# Patient Record
Sex: Male | Born: 1965 | Race: White | Hispanic: No | Smoking: Former smoker
Health system: Southern US, Community
[De-identification: ages and names within clinical notes are randomized; demographics above are authoritative.]

## PROBLEM LIST (undated history)

## (undated) DIAGNOSIS — L4050 Arthropathic psoriasis, unspecified: Secondary | ICD-10-CM

## (undated) DIAGNOSIS — N400 Enlarged prostate without lower urinary tract symptoms: Secondary | ICD-10-CM

## (undated) DIAGNOSIS — T7840XA Allergy, unspecified, initial encounter: Secondary | ICD-10-CM

## (undated) DIAGNOSIS — Z86010 Personal history of colonic polyps: Secondary | ICD-10-CM

## (undated) HISTORY — DX: Arthropathic psoriasis, unspecified: L40.50

## (undated) HISTORY — DX: Personal history of colonic polyps: Z86.010

## (undated) HISTORY — DX: Benign prostatic hyperplasia without lower urinary tract symptoms: N40.0

## (undated) HISTORY — DX: Allergy, unspecified, initial encounter: T78.40XA

## (undated) HISTORY — PX: WISDOM TOOTH EXTRACTION: SHX21

---

## 2013-09-28 ENCOUNTER — Encounter: Payer: Self-pay | Admitting: Sports Medicine

## 2013-09-28 ENCOUNTER — Ambulatory Visit (INDEPENDENT_AMBULATORY_CARE_PROVIDER_SITE_OTHER): Payer: Managed Care, Other (non HMO) | Admitting: Sports Medicine

## 2013-09-28 VITALS — BP 140/83 | HR 89 | Ht 72.0 in | Wt 193.0 lb

## 2013-09-28 DIAGNOSIS — N139 Obstructive and reflux uropathy, unspecified: Secondary | ICD-10-CM

## 2013-09-28 DIAGNOSIS — Z0000 Encounter for general adult medical examination without abnormal findings: Secondary | ICD-10-CM | POA: Insufficient documentation

## 2013-09-28 DIAGNOSIS — L4050 Arthropathic psoriasis, unspecified: Secondary | ICD-10-CM

## 2013-09-28 DIAGNOSIS — R030 Elevated blood-pressure reading, without diagnosis of hypertension: Secondary | ICD-10-CM

## 2013-09-28 DIAGNOSIS — Z299 Encounter for prophylactic measures, unspecified: Secondary | ICD-10-CM

## 2013-09-28 LAB — URINALYSIS
Bilirubin Urine: NEGATIVE
Glucose, UA: NEGATIVE mg/dL
Hgb urine dipstick: NEGATIVE
Ketones, ur: NEGATIVE mg/dL
Leukocytes, UA: NEGATIVE
Nitrite: NEGATIVE
Protein, ur: NEGATIVE mg/dL
Specific Gravity, Urine: >1.03 — ABNORMAL HIGH (ref 1.005–1.030)
Urobilinogen, UA: 0.2 mg/dL (ref 0.0–1.0)
pH: 5 (ref 5.0–8.0)

## 2013-09-28 MED ORDER — CIPROFLOXACIN HCL 750 MG PO TABS: 750.0000 mg | ORAL_TABLET | Freq: Two times a day (BID) | ORAL | Status: DC

## 2013-09-28 NOTE — Assessment & Plan Note (Signed)
Blood work has been done in the past, he is going to forward it to me.

## 2013-09-28 NOTE — Assessment & Plan Note (Signed)
Currently sees a rheumatologist in [facility]. He continues to have an irritating rash on the glans. He is using desonide, I would like him to provide a list of topical steroids that he has used in the past, desonide is getting a bit expensive.

## 2013-09-28 NOTE — Assessment & Plan Note (Signed)
He does get a small amount of burning with urination, has been checked for gonorrhea and chlamydia in the past and was negative. Considering current treatment with methotrexate, burning with urination and new onset obstructive uropathy symptoms we do need to consider prostatitis. He declines prostate exam today, I am going to treat him in therapy with ciprofloxacin for one month. He will come back to see [place] in one month for further evaluation, at that point we will perform a prostate exam, and I will likely start an alpha-blocker.

## 2013-09-28 NOTE — Assessment & Plan Note (Signed)
We will keep an eye on this.

## 2013-09-28 NOTE — Progress Notes (Signed)
  Subjective:    CC: Establish care.   HPI:  Psoriatic arthritis: Has tried multiple medications with his rheumatologist, early methotrexate has kept flares a minimum. He continues to have psoriatic lesions on his glands, which has been improved significantly with the desonide cream. Unfortunately the cream has thinned out the skin, and he does get some irritation now. This was also too expensive for him. When he does get flares they are moderate, persistent, and typically localized in the knee but also his ankles and hips. He did have an effusion with an aspiration and injection by his rheumatologist in the past.  Urinary symptoms: For the past several months has been getting a slight hernia with urination as well as urgency, frequency, and nocturia. No fevers, chills. This seemed to have started when he started methotrexate.  Elevated blood pressure: No history of hypertension, no headaches or visual changes, chest pain, or the swelling.  Preventive medical recently had blood work done late last year, he will have this forwarded to me.  Past medical history, Surgical history, Family history not pertinant except as noted below, Social history, Allergies, and medications have been entered into the medical record, reviewed, and no changes needed.   Review of Systems: No headache, visual changes, nausea, vomiting, diarrhea, constipation, dizziness, abdominal pain, skin rash, fevers, chills, night sweats, swollen lymph nodes, weight loss, chest pain, body aches, joint swelling, muscle aches, shortness of breath, mood changes, visual or auditory hallucinations.  Objective:    General: Well Developed, well nourished, and in no acute distress.  Neuro: Alert and oriented x3, extra-ocular muscles intact, sensation grossly intact.  HEENT: Normocephalic, atraumatic, pupils equal round reactive to light, neck supple, no masses, no lymphadenopathy, thyroid nonpalpable.  Skin: Warm and dry, no rashes noted.   Cardiac: Regular rate and rhythm, no murmurs rubs or gallops.  Respiratory: Clear to auscultation bilaterally. Not using accessory muscles, speaking in full sentences.  Abdominal: Soft, nontender, nondistended, positive bowel sounds, no masses, no organomegaly.  Musculoskeletal: Shoulder, elbow, wrist, hip, knee, ankle stable, and with full range of motion.  Impression and Recommendations:    The patient was counselled, risk factors were discussed, anticipatory guidance given.

## 2013-09-28 NOTE — Patient Instructions (Signed)
Look to see whether you have tried vitamin D. cream, Kenalog (triamcinolone) cream, or clobetasol cream.

## 2013-09-29 LAB — URINE CULTURE
Colony Count: NO GROWTH
Organism ID, Bacteria: NO GROWTH

## 2014-01-20 ENCOUNTER — Encounter: Payer: Self-pay | Admitting: Sports Medicine

## 2014-01-20 ENCOUNTER — Ambulatory Visit (INDEPENDENT_AMBULATORY_CARE_PROVIDER_SITE_OTHER): Payer: Managed Care, Other (non HMO) | Admitting: Sports Medicine

## 2014-01-20 VITALS — BP 123/85 | HR 67 | Ht 66.0 in | Wt 186.0 lb

## 2014-01-20 DIAGNOSIS — Z299 Encounter for prophylactic measures, unspecified: Secondary | ICD-10-CM

## 2014-01-20 DIAGNOSIS — L4050 Arthropathic psoriasis, unspecified: Secondary | ICD-10-CM

## 2014-01-20 DIAGNOSIS — N139 Obstructive and reflux uropathy, unspecified: Secondary | ICD-10-CM

## 2014-01-20 MED ORDER — FLUCONAZOLE 200 MG PO TABS: 200.0000 mg | ORAL_TABLET | ORAL | Status: DC

## 2014-01-20 MED ORDER — CALCIPOTRIENE 0.005 % EX CREA: TOPICAL_CREAM | Freq: Every morning | CUTANEOUS | Status: DC

## 2014-01-20 MED ORDER — TAMSULOSIN HCL 0.4 MG PO CAPS: 0.4000 mg | ORAL_CAPSULE | Freq: Every day | ORAL | Status: DC

## 2014-01-20 MED ORDER — COAL TAR 20 % EX SOLN: 1.0000 "application " | Freq: Every day | CUTANEOUS | Status: DC

## 2014-01-20 NOTE — Assessment & Plan Note (Signed)
Adding routine blood work. He has a physical scheduled in a couple weeks.

## 2014-01-20 NOTE — Progress Notes (Signed)
    Subjective:    CC:  Follow up  HPI: Obstructive uropathy: Continues to wake up several times during the night to void, weak stream, dribbling.   Psoriasis: Good response to methotrexate, he does see a rheumatologist, and has continued to be resistant to Enbrel or Humira. He continues to have an irritation around the glans penis, this has been relatively well controlled with the clobetasol however he is getting some irritation from the steroid use. He is never tried coal tar or calcipotriene topically in this location. He has also never been on an antifungal.   Preventive measure: Scheduled for routine physical coming up soon.   Past medical history, Surgical history, Family history not pertinant except as noted below, Social history, Allergies, and medications have been entered into the medical record, reviewed, and no changes needed.   Review of Systems: No fevers, chills, night sweats, weight loss, chest pain, or shortness of breath.   Objective:    General: Well Developed, well nourished, and in no acute distress.  Neuro: Alert and oriented x3, extra-ocular muscles intact, sensation grossly intact.  HEENT: Normocephalic, atraumatic, pupils equal round reactive to light, neck supple, no masses, no lymphadenopathy, thyroid nonpalpable.  Skin: Warm and dry, no rashes. Cardiac: Regular rate and rhythm, no murmurs rubs or gallops, no lower extremity edema. Respiratory: Clear to auscultation bilaterally. Not using accessory muscles, speaking in full sentences.  Impression and Recommendations:

## 2014-01-20 NOTE — Assessment & Plan Note (Signed)
Adding Flomax. Return in a couple of weeks.

## 2014-01-20 NOTE — Assessment & Plan Note (Signed)
Under good control with methotrexate, 90% lesion clearance with methotrexate. He continues to have penile irritation, he also had a lesion previously but this resolved with clobetasol, unfortunately this is causing some thinning of the skin and irritation. At this point we are going to discontinue steroid creams and try alternating coal tar at night and vitamin D during the day. I'm also going to give him an antifungal. He also like a referral to dermatology which I think is appropriate.

## 2014-02-03 ENCOUNTER — Ambulatory Visit (INDEPENDENT_AMBULATORY_CARE_PROVIDER_SITE_OTHER): Payer: Managed Care, Other (non HMO) | Admitting: Sports Medicine

## 2014-02-03 ENCOUNTER — Ambulatory Visit (INDEPENDENT_AMBULATORY_CARE_PROVIDER_SITE_OTHER): Payer: Managed Care, Other (non HMO)

## 2014-02-03 ENCOUNTER — Encounter: Payer: Self-pay | Admitting: Sports Medicine

## 2014-02-03 VITALS — BP 128/92 | HR 78 | Ht 72.0 in | Wt 189.0 lb

## 2014-02-03 DIAGNOSIS — L4050 Arthropathic psoriasis, unspecified: Secondary | ICD-10-CM

## 2014-02-03 DIAGNOSIS — R0989 Other specified symptoms and signs involving the circulatory and respiratory systems: Secondary | ICD-10-CM

## 2014-02-03 DIAGNOSIS — E559 Vitamin D deficiency, unspecified: Secondary | ICD-10-CM

## 2014-02-03 DIAGNOSIS — Z299 Encounter for prophylactic measures, unspecified: Secondary | ICD-10-CM

## 2014-02-03 DIAGNOSIS — Z0000 Encounter for general adult medical examination without abnormal findings: Secondary | ICD-10-CM

## 2014-02-03 DIAGNOSIS — N139 Obstructive and reflux uropathy, unspecified: Secondary | ICD-10-CM

## 2014-02-03 MED ORDER — VITAMIN D (ERGOCALCIFEROL) 1.25 MG (50000 UNIT) PO CAPS: 50000.0000 [IU] | ORAL_CAPSULE | ORAL | Status: DC

## 2014-02-03 MED ORDER — TAMSULOSIN HCL 0.4 MG PO CAPS: 0.8000 mg | ORAL_CAPSULE | Freq: Every day | ORAL | Status: DC

## 2014-02-03 NOTE — Assessment & Plan Note (Signed)
Excellent response to increase of methotrexate. He is currently discussing possibility of starting Enbrel with his rheumatologist. Overall doing well, penile irritation has improved significantly with discontinuation of topical steroids and switching to calcipotriene. He was unable to obtain the topical tar.

## 2014-02-03 NOTE — Progress Notes (Signed)
  Subjective:    CC: Complete physical exam   HPI:  Preventative measures: Up-to-date on everything except colonoscopy.  Hypovitaminosis D: Noted a recent blood test, has not yet done the repletion.  Obstructive uropathy: Greatly improved with Flomax, wants to increase the dose. No side effects.  Psoriatic arthritis: Improved significantly with increasing dose of methotrexate and calcipotriene cream, was unable to get the coal tar cream.  Past medical history, Surgical history, Family history not pertinant except as noted below, Social history, Allergies, and medications have been entered into the medical record, reviewed, and no changes needed.   Review of Systems: No headache, visual changes, nausea, vomiting, diarrhea, constipation, dizziness, abdominal pain, skin rash, fevers, chills, night sweats, swollen lymph nodes, weight loss, chest pain, body aches, joint swelling, muscle aches, shortness of breath, mood changes, visual or auditory hallucinations.  Objective:    General: Well Developed, well nourished, and in no acute distress.  Neuro: Alert and oriented x3, extra-ocular muscles intact, sensation grossly intact.  HEENT: Normocephalic, atraumatic, pupils equal round reactive to light, neck supple, no masses, no lymphadenopathy, thyroid nonpalpable.  Skin: Warm and dry, no rashes noted.  Cardiac: Regular rate and rhythm, no murmurs rubs or gallops.  Respiratory: Coarse sounds on the right side, clear on the left. Not using accessory muscles, speaking in full sentences.  Abdominal: Soft, nontender, nondistended, positive bowel sounds, no masses, no organomegaly.  Musculoskeletal: Shoulder, elbow, wrist, hip, knee, ankle stable, and with full range of motion.  Chest x-ray is clear.  Impression and Recommendations:    The patient was counselled, risk factors were discussed, anticipatory guidance given.

## 2014-02-03 NOTE — Assessment & Plan Note (Signed)
Increasing 2 Flomax zero point milligrams, excellent response to 0.4.

## 2014-02-03 NOTE — Assessment & Plan Note (Signed)
Abnormally coarse sounds in the right lung field.  Considering concurrent treatment with methotrexate I would like a chest x-ray.

## 2014-02-03 NOTE — Assessment & Plan Note (Signed)
Complete physical performed today. Referral for colonoscopy.

## 2014-02-03 NOTE — Assessment & Plan Note (Signed)
50,000 units weekly for 8 weeks.

## 2014-06-26 ENCOUNTER — Ambulatory Visit (INDEPENDENT_AMBULATORY_CARE_PROVIDER_SITE_OTHER): Payer: Managed Care, Other (non HMO) | Admitting: Sports Medicine

## 2014-06-26 ENCOUNTER — Encounter: Payer: Self-pay | Admitting: Sports Medicine

## 2014-06-26 DIAGNOSIS — L4050 Arthropathic psoriasis, unspecified: Secondary | ICD-10-CM

## 2014-06-26 DIAGNOSIS — N139 Obstructive and reflux uropathy, unspecified: Secondary | ICD-10-CM

## 2014-06-26 MED ORDER — DOXAZOSIN MESYLATE 4 MG PO TABS: 4.0000 mg | ORAL_TABLET | Freq: Every day | ORAL | Status: DC

## 2014-06-26 MED ORDER — LIDOCAINE 5 % EX OINT: 1.0000 "application " | TOPICAL_OINTMENT | Freq: Three times a day (TID) | CUTANEOUS | Status: DC | PRN

## 2014-06-26 NOTE — Assessment & Plan Note (Signed)
Persistent penile irritation with psoriasis. Has not yet tried topical calcipotriene cream. Also adding lidocaine cream. Principal symptom is hypersensitivity. This is after years of topical steroid use.

## 2014-06-26 NOTE — Progress Notes (Signed)
  Subjective:    CC: Follow-up  HPI: Obstructive uropathy: Insufficient response to Flomax 0.8 mg, persistent nocturia over 3 times per night. Significant obstructive symptoms as well. Amenable to switching to doxazosin.  Psoriasis: Principal symptom is irritation of the glans of the penis, we did call in calcipotriene cream which he has not yet used, he did think that this was a topical corticosteroid.  Past medical history, Surgical history, Family history not pertinant except as noted below, Social history, Allergies, and medications have been entered into the medical record, reviewed, and no changes needed.   Review of Systems: No fevers, chills, night sweats, weight loss, chest pain, or shortness of breath.   Objective:    General: Well Developed, well nourished, and in no acute distress.  Neuro: Alert and oriented x3, extra-ocular muscles intact, sensation grossly intact.  HEENT: Normocephalic, atraumatic, pupils equal round reactive to light, neck supple, no masses, no lymphadenopathy, thyroid nonpalpable.  Skin: Warm and dry, no rashes. Cardiac: Regular rate and rhythm, no murmurs rubs or gallops, no lower extremity edema.  Respiratory: Clear to auscultation bilaterally. Not using accessory muscles, speaking in full sentences.  Impression and Recommendations:

## 2014-06-26 NOTE — Assessment & Plan Note (Signed)
Insufficient response to Flomax adnexa does.  Switching to doxazosin.  If still no response we will switch to Rapaflo.

## 2014-07-17 ENCOUNTER — Telehealth: Payer: Self-pay

## 2014-07-17 DIAGNOSIS — N139 Obstructive and reflux uropathy, unspecified: Secondary | ICD-10-CM

## 2014-07-17 MED ORDER — DOXAZOSIN MESYLATE 8 MG PO TABS: 8.0000 mg | ORAL_TABLET | Freq: Every day | ORAL | Status: DC

## 2014-07-17 NOTE — Telephone Encounter (Signed)
Sent in the higher dose. 3 month supply of 8mg  pills.  May double 4mg  pills until runs out.

## 2014-07-17 NOTE — Telephone Encounter (Signed)
[place] states the doxazosin is working better than the [employer]. He is going to go up to 2 tablets at bedtime. Is an increased refill for the Cardura appropriate?

## 2014-11-12 ENCOUNTER — Other Ambulatory Visit: Payer: Self-pay | Admitting: Sports Medicine

## 2014-12-03 IMAGING — CR DG CHEST 2V
2 series · 2 of 2 positions shown · non-contrast
Comparison: None.

CLINICAL DATA: Abnormal right breath sounds.

EXAM:
CHEST  2 VIEW

[view not recorded (1 of 2)]
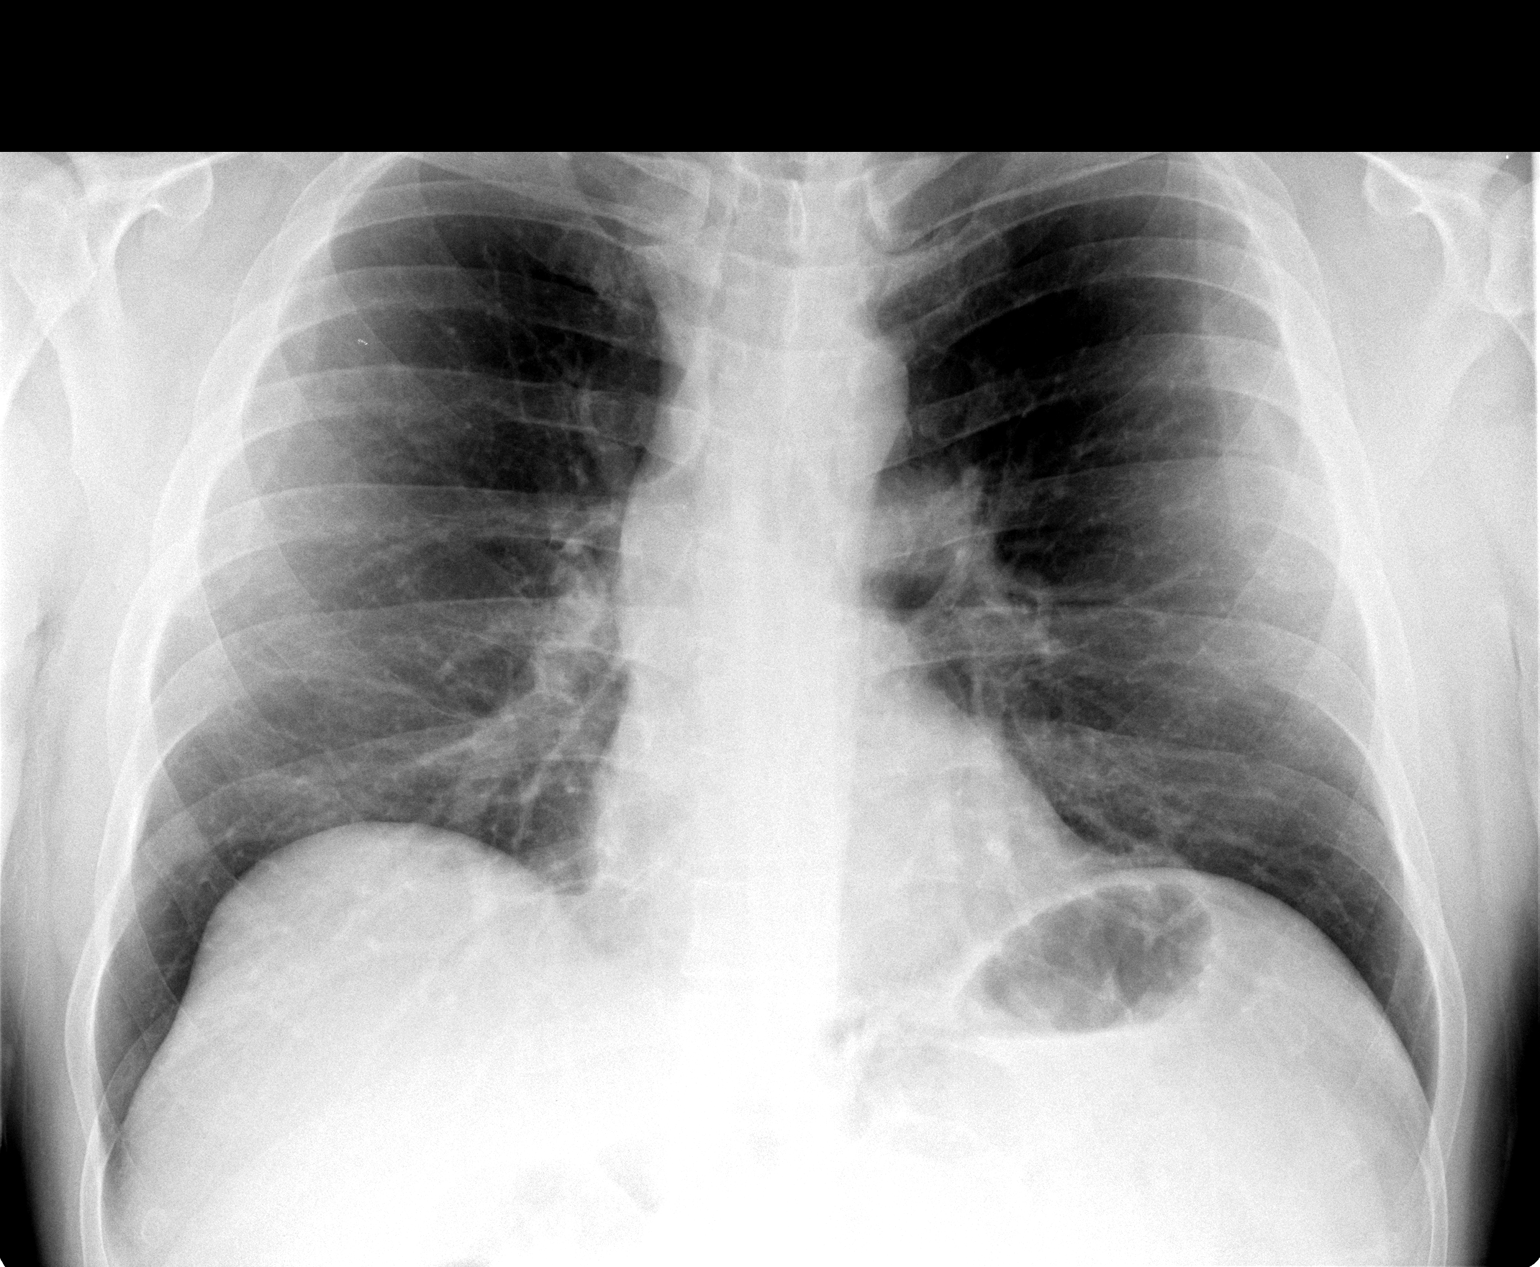

[view not recorded (2 of 2)]
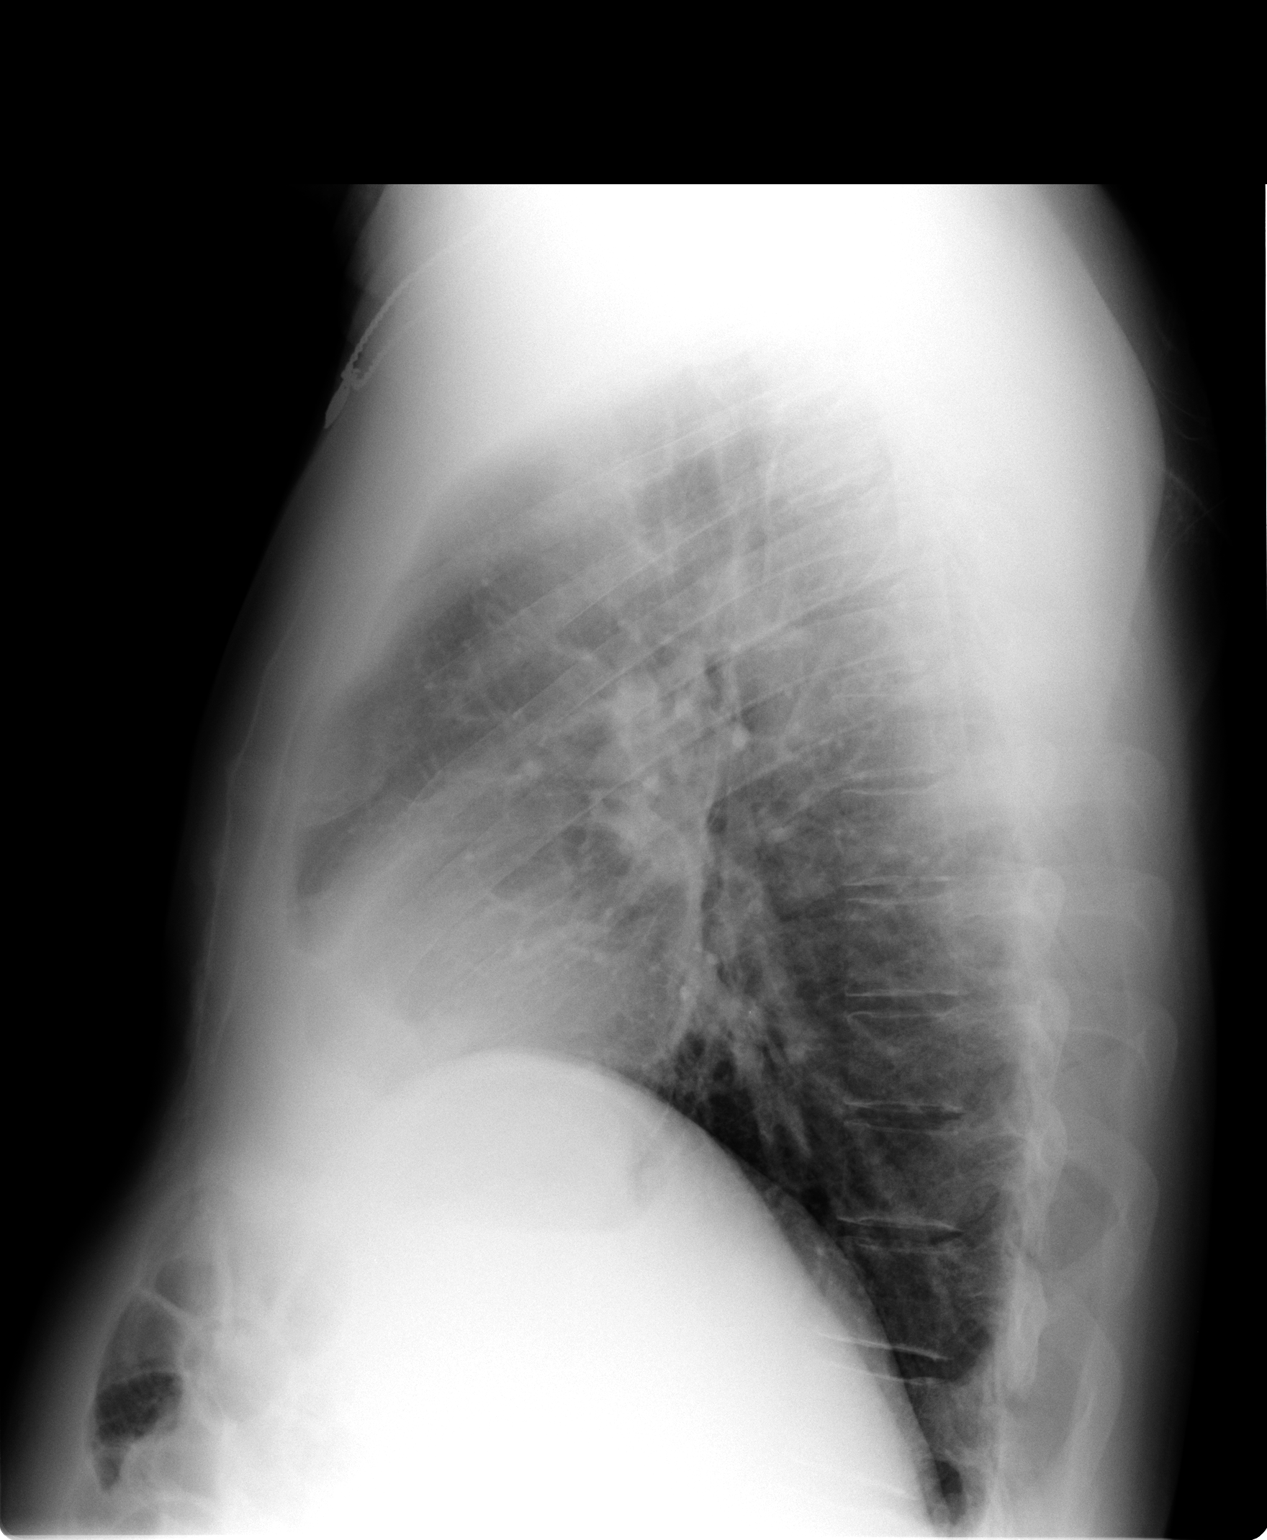

[2 of 2 positions shown; findings below may reference images not displayed]

FINDINGS: The heart size and mediastinal contours are within normal limits.
Both lungs are clear. The visualized skeletal structures are
unremarkable.
IMPRESSION: No active cardiopulmonary disease.

## 2014-12-23 ENCOUNTER — Other Ambulatory Visit: Payer: Self-pay | Admitting: Sports Medicine

## 2015-01-18 ENCOUNTER — Telehealth: Payer: Self-pay

## 2015-01-18 DIAGNOSIS — G4719 Other hypersomnia: Secondary | ICD-10-CM

## 2015-01-18 DIAGNOSIS — Z299 Encounter for prophylactic measures, unspecified: Secondary | ICD-10-CM

## 2015-01-18 NOTE — Telephone Encounter (Signed)
Patient wife called requested a referral for patient for a Colonoscopy  With [facility] also a referral for Sleepy study.  Wife stated that patient is out of town on travel at this moment.Rhonda Cunningham,CMA

## 2015-01-19 NOTE — Telephone Encounter (Signed)
Referrals placed 

## 2015-01-25 ENCOUNTER — Other Ambulatory Visit: Payer: Self-pay | Admitting: Sports Medicine

## 2015-02-14 ENCOUNTER — Encounter: Payer: Self-pay | Admitting: Internal Medicine

## 2015-03-01 ENCOUNTER — Other Ambulatory Visit: Payer: Self-pay | Admitting: Sports Medicine

## 2015-04-05 ENCOUNTER — Encounter (HOSPITAL_BASED_OUTPATIENT_CLINIC_OR_DEPARTMENT_OTHER): Payer: Managed Care, Other (non HMO)

## 2015-04-17 ENCOUNTER — Encounter: Payer: Managed Care, Other (non HMO) | Admitting: Internal Medicine

## 2015-04-30 ENCOUNTER — Encounter: Payer: Managed Care, Other (non HMO) | Admitting: Internal Medicine

## 2015-05-03 ENCOUNTER — Ambulatory Visit (HOSPITAL_BASED_OUTPATIENT_CLINIC_OR_DEPARTMENT_OTHER): Payer: Managed Care, Other (non HMO) | Attending: Sports Medicine | Admitting: *Deleted

## 2015-05-03 VITALS — Ht 73.0 in | Wt 199.0 lb

## 2015-05-03 DIAGNOSIS — R0683 Snoring: Secondary | ICD-10-CM | POA: Diagnosis present

## 2015-05-03 DIAGNOSIS — G4733 Obstructive sleep apnea (adult) (pediatric): Secondary | ICD-10-CM | POA: Diagnosis not present

## 2015-05-06 ENCOUNTER — Encounter: Payer: Self-pay | Admitting: Sports Medicine

## 2015-05-06 DIAGNOSIS — R0683 Snoring: Secondary | ICD-10-CM | POA: Diagnosis not present

## 2015-05-06 DIAGNOSIS — G4733 Obstructive sleep apnea (adult) (pediatric): Secondary | ICD-10-CM | POA: Insufficient documentation

## 2015-05-06 NOTE — Progress Notes (Signed)
  Patient Name: Michael Neal, Michael Neal Date: 05/03/2015 Gender: Male D.O.B: [birth year] Age (years): 49 Referring Provider: Gwen Her Thekkekandam Height (inches): 73 Interpreting Physician: Baird Lyons MD, ABSM Weight (lbs): 199 RPSGT: Gerhard Perches BMI: 26 MRN: [id number] Neck Size: 16.25 CLINICAL INFORMATION Sleep Study Type: NPSG  Indication for sleep study: Snoring  Epworth Sleepiness Score: 9  SLEEP STUDY TECHNIQUE As per the AASM Manual for the Scoring of Sleep and Associated Events v2.3 (April 2016) with a hypopnea requiring 4% desaturations.  The channels recorded and monitored were frontal, central and occipital EEG, electrooculogram (EOG), submentalis EMG (chin), nasal and oral airflow, thoracic and abdominal wall motion, anterior tibialis EMG, snore microphone, electrocardiogram, and pulse oximetry.  MEDICATIONS Patient's medications include: charted for review.  Medications self-administered by patient during sleep study : No sleep medicine administered.  SLEEP ARCHITECTURE The study was initiated at 10:13:10 PM and ended at 4:41:14 AM.  Sleep onset time was 5.8 minutes and the sleep efficiency was 87.3%. The total sleep time was 338.8 minutes.  Stage REM latency was 106.0 minutes.  The patient spent 9.38% of the night in stage N1 sleep, 74.24% in stage N2 sleep, 3.54% in stage N3 and 12.84% in REM.  Alpha intrusion was absent.  Supine sleep was 38.18%.  RESPIRATORY PARAMETERS The overall apnea/hypopnea index (AHI) was 7.3 per hour. There were 31 total apneas, including 31 obstructive, 0 central and 0 mixed apneas. There were 10 hypopneas and 80 RERAs.  The AHI during Stage REM sleep was 4.1 per hour.  AHI while supine was 14.4 per hour.  The mean oxygen saturation was 93.76%. The minimum SpO2 during sleep was 82.00%.  Loud snoring was noted during this study.  CARDIAC DATA The 2 lead EKG demonstrated sinus rhythm. The mean heart rate was 60.65  beats per minute. Other EKG findings include: None.  LEG MOVEMENT DATA The total PLMS were 3 with a resulting PLMS index of 0.53. Associated arousal with leg movement index was 0.5 .  IMPRESSIONS Mild obstructive sleep apnea occurred during this study (AHI = 7.3/h). There were not enough early events to qualify for split protocol CPAP titration. No significant central sleep apnea occurred during this study (CAI = 0.0/h). Mild oxygen desaturation was noted during this study (Min O2 = 82.00%). The patient snored with Loud snoring volume. No cardiac abnormalities were noted during this study. Clinically significant periodic limb movements did not occur during sleep. No significant associated arousals.   DIAGNOSIS Obstructive Sleep Apnea (327.23 [G47.33 ICD-10])   RECOMMENDATIONS Positional therapy avoiding supine position during sleep. Mild obstructive sleep apnea. Return to discuss treatment options. Avoid alcohol, sedatives and other CNS depressants that may worsen sleep apnea and disrupt normal sleep architecture. Sleep hygiene should be reviewed to assess factors that may improve sleep quality. Weight management and regular exercise should be initiated or continued if appropriate.  Deneise Lever Diplomate, American Board of Sleep Medicine  ELECTRONICALLY SIGNED ON:  05/06/2015, 1:06 PM [place] PH: [phone]   FX: [phone] [place]

## 2015-05-28 ENCOUNTER — Other Ambulatory Visit: Payer: Self-pay | Admitting: Sports Medicine

## 2015-06-12 ENCOUNTER — Ambulatory Visit (AMBULATORY_SURGERY_CENTER): Payer: Self-pay | Admitting: *Deleted

## 2015-06-12 VITALS — Ht 72.0 in | Wt 192.0 lb

## 2015-06-12 DIAGNOSIS — Z1211 Encounter for screening for malignant neoplasm of colon: Secondary | ICD-10-CM

## 2015-06-12 NOTE — Progress Notes (Signed)
No egg or soy allergy No issues with past sedation No diet pills No home 02 use  emmi declined  

## 2015-06-27 ENCOUNTER — Ambulatory Visit (AMBULATORY_SURGERY_CENTER): Payer: Managed Care, Other (non HMO) | Admitting: Internal Medicine

## 2015-06-27 ENCOUNTER — Encounter: Payer: Self-pay | Admitting: Internal Medicine

## 2015-06-27 VITALS — BP 114/71 | HR 51 | Temp 96.0°F | Resp 18 | Ht 72.0 in | Wt 192.0 lb

## 2015-06-27 DIAGNOSIS — D120 Benign neoplasm of cecum: Secondary | ICD-10-CM

## 2015-06-27 DIAGNOSIS — Z1211 Encounter for screening for malignant neoplasm of colon: Secondary | ICD-10-CM | POA: Diagnosis present

## 2015-06-27 MED ORDER — SODIUM CHLORIDE 0.9 % IV SOLN: 500.0000 mL | INTRAVENOUS | Status: DC

## 2015-06-27 NOTE — Patient Instructions (Addendum)
I removed 2 very tiny polyps that look benign.  I will let you know pathology results and when to have another routine colonoscopy by mail.  You also have a condition called diverticulosis - common and not usually a problem. Please read the handout provided.  I appreciate the opportunity to care for you. Gatha Mayer, MD, FACG   YOU HAD AN ENDOSCOPIC PROCEDURE TODAY AT [facility]:   Refer to the procedure report that was given to you for any specific questions about what was found during the examination.  If the procedure report does not answer your questions, please call your gastroenterologist to clarify.  If you requested that your care partner not be given the details of your procedure findings, then the procedure report has been included in a sealed envelope for you to review at your convenience later.  YOU SHOULD EXPECT: Some feelings of bloating in the abdomen. Passage of more gas than usual.  Walking can help get rid of the air that was put into your GI tract during the procedure and reduce the bloating. If you had a lower endoscopy (such as a colonoscopy or flexible sigmoidoscopy) you may notice spotting of blood in your stool or on the toilet paper. If you underwent a bowel prep for your procedure, you may not have a normal bowel movement for a few days.  Please Note:  You might notice some irritation and congestion in your nose or some drainage.  This is from the oxygen used during your procedure.  There is no need for concern and it should clear up in a day or so.  SYMPTOMS TO REPORT IMMEDIATELY:   Following lower endoscopy (colonoscopy or flexible sigmoidoscopy):  Excessive amounts of blood in the stool  Significant tenderness or worsening of abdominal pains  Swelling of the abdomen that is new, acute  Fever of 100F or higher   For urgent or emergent issues, a gastroenterologist can be reached at any hour by calling [phone].   DIET: Your  first meal following the procedure should be a small meal and then it is ok to progress to your normal diet. Heavy or fried foods are harder to digest and may make you feel nauseous or bloated.  Likewise, meals heavy in dairy and vegetables can increase bloating.  Drink plenty of fluids but you should avoid alcoholic beverages for 24 hours.  ACTIVITY:  You should plan to take it easy for the rest of today and you should NOT DRIVE or use heavy machinery until tomorrow (because of the sedation medicines used during the test).    FOLLOW UP: Our staff will call the number listed on your records the next business day following your procedure to check on you and address any questions or concerns that you may have regarding the information given to you following your procedure. If we do not reach you, we will leave a message.  However, if you are feeling well and you are not experiencing any problems, there is no need to return our call.  We will assume that you have returned to your regular daily activities without incident.  If any biopsies were taken you will be contacted by phone or by letter within the next 1-3 weeks.  Please call us at [phone] if you have not heard about the biopsies in 3 weeks.    SIGNATURES/CONFIDENTIALITY: You and/or your care partner have signed paperwork which will be entered into your electronic medical record.  These signatures attest to the fact that that the information above on your After Visit Summary has been reviewed and is understood.  Full responsibility of the confidentiality of this discharge information lies with you and/or your care-partner.

## 2015-06-27 NOTE — Op Note (Signed)
[place]  [address] [place], [id number]   COLONOSCOPY PROCEDURE REPORT  PATIENT: Michael, Neal  MR#: [id number] BIRTHDATE: 04-Dec-1965 , 49  yrs. old GENDER: male ENDOSCOPIST: Gatha Mayer, MD, [facility] PROCEDURE DATE:  06/27/2015 PROCEDURE:   Colonoscopy, screening and Colonoscopy with biopsy First Screening Colonoscopy - Avg.  risk and is 50 yrs.  old or older Yes.  Prior Negative Screening - Now for repeat screening. N/A  History of Adenoma - Now for follow-up colonoscopy & has been > or = to 3 yrs.  N/A  Polyps removed today? Yes ASA CLASS:   Class II INDICATIONS:Screening for colonic neoplasia and Colorectal Neoplasm Risk Assessment for this procedure is average risk. MEDICATIONS: Propofol 400 mg IV and Monitored anesthesia care  DESCRIPTION OF PROCEDURE:   After the risks benefits and alternatives of the procedure were thoroughly explained, informed consent was obtained.  The digital rectal exam revealed no abnormalities of the rectum, revealed no prostatic nodules, and revealed the prostate was not enlarged.   The [id number] endoscope was introduced through the anus and advanced to the cecum, which was identified by both the appendix and ileocecal valve. No adverse events experienced.   The quality of the prep was excellent.  (MiraLax was used)  The instrument was then slowly withdrawn as the colon was fully examined. Estimated blood loss is zero unless otherwise noted in this procedure report.   COLON FINDINGS: Two sessile polyps ranging from 1 to 36mm in size were found at the cecum.  Polypectomies were performed with cold forceps.  The resection was complete, the polyp tissue was completely retrieved and sent to histology.   There was mild diverticulosis noted in the sigmoid colon.   The examination was otherwise normal.  Retroflexed views revealed no abnormalities. The time to cecum = 2.1 Withdrawal time = 11.0   The scope was withdrawn  and the procedure completed. COMPLICATIONS: There were no immediate complications.  ENDOSCOPIC IMPRESSION: 1.   Two sessile polyps ranging from 1 to 44mm in size were found at the cecum; polypectomies were performed with cold forceps 2.   Mild diverticulosis was noted in the sigmoid colon 3.   The examination was otherwise normal - excellent prep - first screening  RECOMMENDATIONS: Timing of repeat colonoscopy will be determined by pathology findings.  eSigned:  Gatha Mayer, MD, [facility] 06/27/2015 12:01 PM   cc: Dr. Derl Barrow and The Patient

## 2015-06-27 NOTE — Progress Notes (Signed)
Patient awakening,vss,report to rn 

## 2015-06-27 NOTE — Progress Notes (Signed)
Called to room to assist during endoscopic procedure.  Patient ID and intended procedure confirmed with present staff. Received instructions for my participation in the procedure from the performing physician.  

## 2015-06-28 ENCOUNTER — Telehealth: Payer: Self-pay | Admitting: Internal Medicine

## 2015-06-28 NOTE — Telephone Encounter (Signed)
°  Follow up Call-  Call back number 06/27/2015  Post procedure Call Back phone  # [phone]  Permission to leave phone message Yes     Called patient at number given. No answer. Left message on voice mail.

## 2015-07-01 ENCOUNTER — Other Ambulatory Visit: Payer: Self-pay | Admitting: Sports Medicine

## 2015-07-08 ENCOUNTER — Encounter: Payer: Self-pay | Admitting: Internal Medicine

## 2015-07-08 DIAGNOSIS — Z86010 Personal history of colonic polyps: Secondary | ICD-10-CM

## 2015-07-08 DIAGNOSIS — Z860101 Personal history of adenomatous and serrated colon polyps: Secondary | ICD-10-CM

## 2015-07-08 HISTORY — DX: Personal history of colonic polyps: Z86.010

## 2015-07-08 HISTORY — DX: Personal history of adenomatous and serrated colon polyps: Z86.0101

## 2015-07-08 NOTE — Progress Notes (Signed)
Quick Note:  1-2 mm adenoma - recall 7 yrs 2023 ______

## 2015-08-01 ENCOUNTER — Other Ambulatory Visit: Payer: Self-pay | Admitting: Sports Medicine

## 2015-08-27 ENCOUNTER — Other Ambulatory Visit: Payer: Self-pay | Admitting: Sports Medicine

## 2015-09-29 ENCOUNTER — Other Ambulatory Visit: Payer: Self-pay | Admitting: Sports Medicine

## 2015-10-19 ENCOUNTER — Telehealth: Payer: Self-pay

## 2015-10-19 MED ORDER — OSELTAMIVIR PHOSPHATE 75 MG PO CAPS: 75.0000 mg | ORAL_CAPSULE | Freq: Two times a day (BID) | ORAL | Status: DC

## 2015-10-19 NOTE — Telephone Encounter (Signed)
Patient states his wife and 2 sons has been diagnosed with the flu. He would like tamiflu sent in. He is currently taking Humira. Please advise.

## 2015-10-19 NOTE — Telephone Encounter (Signed)
Done

## 2015-10-19 NOTE — Telephone Encounter (Signed)
Left message advising medication is at the pharmacy.

## 2015-10-30 ENCOUNTER — Other Ambulatory Visit: Payer: Self-pay | Admitting: Sports Medicine

## 2015-11-29 ENCOUNTER — Other Ambulatory Visit: Payer: Self-pay

## 2015-11-29 MED ORDER — DOXAZOSIN MESYLATE 8 MG PO TABS: 8.0000 mg | ORAL_TABLET | Freq: Every day | ORAL | Status: DC

## 2015-12-24 ENCOUNTER — Other Ambulatory Visit: Payer: Self-pay | Admitting: Sports Medicine

## 2016-01-24 ENCOUNTER — Other Ambulatory Visit: Payer: Self-pay | Admitting: Family Medicine

## 2016-02-21 ENCOUNTER — Encounter: Payer: Self-pay | Admitting: Sports Medicine

## 2016-02-21 ENCOUNTER — Ambulatory Visit (INDEPENDENT_AMBULATORY_CARE_PROVIDER_SITE_OTHER): Payer: Managed Care, Other (non HMO) | Admitting: Sports Medicine

## 2016-02-21 ENCOUNTER — Other Ambulatory Visit: Payer: Self-pay | Admitting: Sports Medicine

## 2016-02-21 VITALS — BP 127/85 | HR 75 | Resp 18 | Wt 182.6 lb

## 2016-02-21 DIAGNOSIS — G4733 Obstructive sleep apnea (adult) (pediatric): Secondary | ICD-10-CM

## 2016-02-21 DIAGNOSIS — Z0000 Encounter for general adult medical examination without abnormal findings: Secondary | ICD-10-CM | POA: Diagnosis not present

## 2016-02-21 DIAGNOSIS — E781 Pure hyperglyceridemia: Secondary | ICD-10-CM | POA: Diagnosis not present

## 2016-02-21 DIAGNOSIS — F411 Generalized anxiety disorder: Secondary | ICD-10-CM

## 2016-02-21 MED ORDER — DOXAZOSIN MESYLATE ER 8 MG PO TB24: 8.0000 mg | ORAL_TABLET | Freq: Every day | ORAL | Status: DC

## 2016-02-21 MED ORDER — SERTRALINE HCL 50 MG PO TABS: 50.0000 mg | ORAL_TABLET | Freq: Every day | ORAL | Status: DC

## 2016-02-21 NOTE — Assessment & Plan Note (Signed)
Refilling doxazosin

## 2016-02-21 NOTE — Assessment & Plan Note (Signed)
Starting low dose zoloft, return in one month for a PHQ9 and GAD7

## 2016-02-21 NOTE — Assessment & Plan Note (Signed)
Patient return for his physical, ordering routine blood work.

## 2016-02-21 NOTE — Progress Notes (Signed)
  Subjective:    CC: Follow-up  HPI: I have not seen this pleasant 50 year old male in some time, he's had issues with anxiety for the past several months, notes moderate trouble relaxing, irritability, mild nervousness, difficulty controlling his worry, worrying about different things, restlessness. Also has mild difficulty concentrating in trouble waking up, no suicidal or homicidal ideation. He has great insight and understands this likely represents generalized anxiety disorder.  Obstructive uropathy/BPH: Needs a refill on doxazosin. He did see the urologist, no abnormalities found, rectal exam was 4 months ago, will not need another exam when his physical comes around.  Due for blood work  Past medical history, Surgical history, Family history not pertinant except as noted below, Social history, Allergies, and medications have been entered into the medical record, reviewed, and no changes needed.   Review of Systems: No fevers, chills, night sweats, weight loss, chest pain, or shortness of breath.   Objective:    General: Well Developed, well nourished, and in no acute distress.  Neuro: Alert and oriented x3, extra-ocular muscles intact, sensation grossly intact.  HEENT: Normocephalic, atraumatic, pupils equal round reactive to light, neck supple, no masses, no lymphadenopathy, thyroid nonpalpable.  Skin: Warm and dry, no rashes. Cardiac: Regular rate and rhythm, no murmurs rubs or gallops, no lower extremity edema.  Respiratory: Clear to auscultation bilaterally. Not using accessory muscles, speaking in full sentences.  Impression and Recommendations:    I spent 40 minutes with this patient, greater than 50% was face-to-face time counseling regarding the above diagnoses

## 2016-03-04 DIAGNOSIS — E781 Pure hyperglyceridemia: Secondary | ICD-10-CM | POA: Insufficient documentation

## 2016-03-04 LAB — COMPREHENSIVE METABOLIC PANEL
ALT: 20 U/L (ref 9–46)
Alkaline Phosphatase: 67 U/L (ref 40–115)
BUN: 18 mg/dL (ref 7–25)
CO2: 25 mmol/L (ref 20–31)
Chloride: 106 mmol/L (ref 98–110)
Glucose, Bld: 94 mg/dL (ref 65–99)
Potassium: 3.8 mmol/L (ref 3.5–5.3)

## 2016-03-04 LAB — LIPID PANEL
Cholesterol: 143 mg/dL (ref 125–200)
HDL: 32 mg/dL — ABNORMAL LOW (ref >=40)
LDL Cholesterol: 74 mg/dL (ref <130)
Total CHOL/HDL Ratio: 4.5 Ratio (ref <=5.0)
Triglycerides: 187 mg/dL — ABNORMAL HIGH (ref <150)
VLDL: 37 mg/dL — ABNORMAL HIGH (ref <30)

## 2016-03-04 LAB — CBC
HCT: 39.4 % (ref 38.5–50.0)
Hemoglobin: 13.8 g/dL (ref 13.2–17.1)
MCH: 30.4 pg (ref 27.0–33.0)
MCHC: 35 g/dL (ref 32.0–36.0)
MCV: 86.8 fL (ref 80.0–100.0)
MPV: 11.8 fL (ref 7.5–12.5)
Platelets: 147 10*3/uL (ref 140–400)
RBC: 4.54 MIL/uL (ref 4.20–5.80)
RDW: 13.9 % (ref 11.0–15.0)
WBC: 6.6 K/uL (ref 3.8–10.8)

## 2016-03-04 LAB — TSH: TSH: 1.25 m[IU]/L (ref 0.40–4.50)

## 2016-03-04 LAB — COMPREHENSIVE METABOLIC PANEL WITH GFR
AST: 15 U/L (ref 10–35)
Albumin: 4.3 g/dL (ref 3.6–5.1)
Calcium: 8.8 mg/dL (ref 8.6–10.3)
Creat: 0.94 mg/dL (ref 0.70–1.33)
Sodium: 140 mmol/L (ref 135–146)
Total Bilirubin: 2 mg/dL — ABNORMAL HIGH (ref 0.2–1.2)
Total Protein: 6.5 g/dL (ref 6.1–8.1)

## 2016-03-04 LAB — HEMOGLOBIN A1C
Hgb A1c MFr Bld: 5.4 % (ref <5.7)
Mean Plasma Glucose: 108 mg/dL

## 2016-03-04 LAB — TESTOSTERONE: Testosterone: 289 ng/dL (ref 250–827)

## 2016-03-04 LAB — VITAMIN D 25 HYDROXY (VIT D DEFICIENCY, FRACTURES): Vit D, 25-Hydroxy: 33 ng/mL (ref 30–100)

## 2016-03-13 ENCOUNTER — Encounter: Payer: Self-pay | Admitting: Sports Medicine

## 2016-03-13 ENCOUNTER — Ambulatory Visit (INDEPENDENT_AMBULATORY_CARE_PROVIDER_SITE_OTHER): Payer: Managed Care, Other (non HMO) | Admitting: Sports Medicine

## 2016-03-13 DIAGNOSIS — F411 Generalized anxiety disorder: Secondary | ICD-10-CM | POA: Diagnosis not present

## 2016-03-13 DIAGNOSIS — Z0000 Encounter for general adult medical examination without abnormal findings: Secondary | ICD-10-CM | POA: Diagnosis not present

## 2016-03-13 NOTE — Assessment & Plan Note (Signed)
Doing well, physical done today. Return in one year.

## 2016-03-13 NOTE — Assessment & Plan Note (Signed)
Well-controlled on low-dose Zoloft. Patient is happy with results and does not desire any changes in dose.

## 2016-03-13 NOTE — Progress Notes (Signed)
  Subjective:    CC: Annual physical exam   HPI:  This is a pleasant 50 year old male here for his physical, no complaints.  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  Generalized anxiety: Improved significantly on low-dose Zoloft, now only has mild difficulty concentrating, no other depressive symptoms and only mild nervousness, trouble relaxing and irritability, no other anxiety symptoms.  Past medical history, Surgical history, Family history not pertinant except as noted below, Social history, Allergies, and medications have been entered into the medical record, reviewed, and no changes needed.   Review of Systems: No headache, visual changes, nausea, vomiting, diarrhea, constipation, dizziness, abdominal pain, skin rash, fevers, chills, night sweats, swollen lymph nodes, weight loss, chest pain, body aches, joint swelling, muscle aches, shortness of breath, mood changes, visual or auditory hallucinations.  Objective:   General: Well Developed, well nourished, and in no acute distress.  Neuro: Alert and oriented x3, extra-ocular muscles intact, sensation grossly intact. Cranial nerves II through XII are intact, motor, sensory, and coordinative functions are all intact. HEENT: Normocephalic, atraumatic, pupils equal round reactive to light, neck supple, no masses, no lymphadenopathy, thyroid nonpalpable. Oropharynx, nasopharynx, external ear canals are unremarkable. Skin: Warm and dry, no rashes noted.  Cardiac: Regular rate and rhythm, no murmurs rubs or gallops.  Respiratory: Clear to auscultation bilaterally. Not using accessory muscles, speaking in full sentences.  Abdominal: Soft, nontender, nondistended, positive bowel sounds, no masses, no organomegaly.  Musculoskeletal: Shoulder, elbow, wrist, hip, knee, ankle stable, and with full range of motion.  Impression and Recommendations:    The patient was counselled, risk factors were  discussed, anticipatory guidance given.  Annual physical exam Doing well, physical done today. Return in one year.  Generalized anxiety disorder Well-controlled on low-dose Zoloft. Patient is happy with results and does not desire any changes in dose.

## 2016-03-21 ENCOUNTER — Other Ambulatory Visit: Payer: Self-pay | Admitting: Sports Medicine

## 2016-04-20 ENCOUNTER — Other Ambulatory Visit: Payer: Self-pay | Admitting: Sports Medicine

## 2016-04-29 ENCOUNTER — Telehealth: Payer: Self-pay

## 2016-04-29 MED ORDER — VALACYCLOVIR HCL 1 G PO TABS: 1000.0000 mg | ORAL_TABLET | Freq: Two times a day (BID) | ORAL | 2 refills | Status: DC

## 2016-04-29 NOTE — Telephone Encounter (Signed)
Valtrex often works better, calling this in.

## 2016-04-29 NOTE — Telephone Encounter (Signed)
Pt would like a refill of acyclovir for a cold sore. Please assist.

## 2016-05-24 ENCOUNTER — Other Ambulatory Visit: Payer: Self-pay | Admitting: Sports Medicine

## 2016-06-06 ENCOUNTER — Telehealth: Payer: Self-pay

## 2016-06-06 DIAGNOSIS — L4050 Arthropathic psoriasis, unspecified: Secondary | ICD-10-CM

## 2016-06-06 DIAGNOSIS — F411 Generalized anxiety disorder: Secondary | ICD-10-CM

## 2016-06-06 MED ORDER — SERTRALINE HCL 100 MG PO TABS: 100.0000 mg | ORAL_TABLET | Freq: Every day | ORAL | 3 refills | Status: DC

## 2016-06-06 MED ORDER — CALCIPOTRIENE 0.005 % EX CREA: TOPICAL_CREAM | Freq: Every morning | CUTANEOUS | 3 refills | Status: DC

## 2016-06-06 NOTE — Telephone Encounter (Signed)
Pt would like to know if you're still willing to bump up his sertraline dose. Was also wondering if her can get a refill on vitamin d cream left his old rx in a hotel room. Please assist.

## 2016-06-06 NOTE — Telephone Encounter (Signed)
Most certainly most certainly, increasing Zoloft 100 mg daily and refilling calcipotriene.

## 2016-06-29 ENCOUNTER — Other Ambulatory Visit: Payer: Self-pay | Admitting: Sports Medicine

## 2016-06-29 DIAGNOSIS — F411 Generalized anxiety disorder: Secondary | ICD-10-CM

## 2016-07-30 ENCOUNTER — Other Ambulatory Visit: Payer: Self-pay | Admitting: Sports Medicine

## 2016-07-30 DIAGNOSIS — F411 Generalized anxiety disorder: Secondary | ICD-10-CM

## 2016-08-29 ENCOUNTER — Other Ambulatory Visit: Payer: Self-pay

## 2016-08-29 DIAGNOSIS — L4050 Arthropathic psoriasis, unspecified: Secondary | ICD-10-CM

## 2016-08-29 DIAGNOSIS — F411 Generalized anxiety disorder: Secondary | ICD-10-CM

## 2016-08-29 MED ORDER — CALCIPOTRIENE 0.005 % EX CREA: TOPICAL_CREAM | Freq: Every morning | CUTANEOUS | 3 refills | Status: DC

## 2016-08-29 MED ORDER — DOXAZOSIN MESYLATE 8 MG PO TABS: ORAL_TABLET | ORAL | 1 refills | Status: DC

## 2016-08-29 MED ORDER — SERTRALINE HCL 100 MG PO TABS: 100.0000 mg | ORAL_TABLET | Freq: Every day | ORAL | 3 refills | Status: DC

## 2016-09-01 ENCOUNTER — Other Ambulatory Visit: Payer: Self-pay

## 2016-09-01 DIAGNOSIS — G4733 Obstructive sleep apnea (adult) (pediatric): Secondary | ICD-10-CM

## 2016-09-01 DIAGNOSIS — F411 Generalized anxiety disorder: Secondary | ICD-10-CM

## 2016-09-01 MED ORDER — SERTRALINE HCL 100 MG PO TABS: 100.0000 mg | ORAL_TABLET | Freq: Every day | ORAL | 3 refills | Status: DC

## 2016-09-01 MED ORDER — DOXAZOSIN MESYLATE ER 8 MG PO TB24: 8.0000 mg | ORAL_TABLET | Freq: Every day | ORAL | 3 refills | Status: DC

## 2016-09-02 ENCOUNTER — Other Ambulatory Visit: Payer: Self-pay

## 2016-09-25 ENCOUNTER — Other Ambulatory Visit: Payer: Self-pay

## 2016-09-25 DIAGNOSIS — G4733 Obstructive sleep apnea (adult) (pediatric): Secondary | ICD-10-CM

## 2016-12-25 ENCOUNTER — Other Ambulatory Visit: Payer: Self-pay | Admitting: Sports Medicine

## 2017-02-23 ENCOUNTER — Other Ambulatory Visit: Payer: Self-pay | Admitting: Sports Medicine

## 2017-05-18 ENCOUNTER — Other Ambulatory Visit: Payer: Self-pay | Admitting: Sports Medicine

## 2017-05-18 DIAGNOSIS — F411 Generalized anxiety disorder: Secondary | ICD-10-CM

## 2017-05-31 ENCOUNTER — Other Ambulatory Visit: Payer: Self-pay | Admitting: Sports Medicine

## 2017-06-15 NOTE — Progress Notes (Signed)
Flu Vaccine Info  Lot # [id number] NDC: [phone] Mfg: [employer] Expires: 02/07/18 RT deltoid

## 2017-07-29 ENCOUNTER — Other Ambulatory Visit: Payer: Self-pay | Admitting: Sports Medicine

## 2017-09-06 ENCOUNTER — Other Ambulatory Visit: Payer: Self-pay | Admitting: Sports Medicine

## 2017-10-22 ENCOUNTER — Other Ambulatory Visit: Payer: Self-pay | Admitting: Sports Medicine

## 2017-11-30 ENCOUNTER — Other Ambulatory Visit: Payer: Self-pay | Admitting: Sports Medicine

## 2017-12-02 ENCOUNTER — Other Ambulatory Visit: Payer: Self-pay | Admitting: Sports Medicine

## 2018-01-01 ENCOUNTER — Ambulatory Visit: Payer: 59 | Admitting: Sports Medicine

## 2018-01-01 ENCOUNTER — Encounter: Payer: Self-pay | Admitting: Sports Medicine

## 2018-01-01 DIAGNOSIS — Z0000 Encounter for general adult medical examination without abnormal findings: Secondary | ICD-10-CM | POA: Diagnosis not present

## 2018-01-01 DIAGNOSIS — J0110 Acute frontal sinusitis, unspecified: Secondary | ICD-10-CM | POA: Diagnosis not present

## 2018-01-01 MED ORDER — FLUTICASONE PROPIONATE 50 MCG/ACT NA SUSP: NASAL | 3 refills | Status: DC

## 2018-01-01 MED ORDER — PREDNISONE 50 MG PO TABS: ORAL_TABLET | ORAL | 0 refills | Status: DC

## 2018-01-01 MED ORDER — AZITHROMYCIN 250 MG PO TABS: ORAL_TABLET | ORAL | 0 refills | Status: DC

## 2018-01-01 NOTE — Progress Notes (Signed)
Subjective:    CC: Facial pressure  HPI: For the past month this pleasant 52 year old male has had increasing runny nose, or throat, followed by frontal facial pressure.  He was seen in an outside urgent care, prescribed a nasal steroid and told to follow-up as needed.  This may have helped a small degree but he still has significant facial pressure, stuffiness in his ears, postnasal drip.  No fevers or chills, shortness of breath, chest pain, symptoms are moderate, persistent.  He is also due for complete physical exam and some routine labs.  I reviewed the past medical history, family history, social history, surgical history, and allergies today and no changes were needed.  Please see the problem list section below in epic for further details.  Past Medical History: Past Medical History:  Diagnosis Date  . Allergy    seasonal  . BPH (benign prostatic hyperplasia)   . Hx of adenomatous polyp of colon 07/08/2015  . Psoriatic arthritis [facility])    Past Surgical History: Past Surgical History:  Procedure Laterality Date  . WISDOM TOOTH EXTRACTION     with sedation   Social History: Social History   Socioeconomic History  . Marital status: Unknown    Spouse name: Not on file  . Number of children: Not on file  . Years of education: Not on file  . Highest education level: Not on file  Occupational History  . Not on file  Social Needs  . Financial resource strain: Not on file  . Food insecurity:    Worry: Not on file    Inability: Not on file  . Transportation needs:    Medical: Not on file    Non-medical: Not on file  Tobacco Use  . Smoking status: Former Smoker    Last attempt to quit: 08/07/2012    Years since quitting: 5.4  . Smokeless tobacco: Never Used  Substance and Sexual Activity  . Alcohol use: No    Alcohol/week: 0.0 oz  . Drug use: No  . Sexual activity: Not on file  Lifestyle  . Physical activity:    Days per week: Not on file    Minutes per session:  Not on file  . Stress: Not on file  Relationships  . Social connections:    Talks on phone: Not on file    Gets together: Not on file    Attends religious service: Not on file    Active member of club or organization: Not on file    Attends meetings of clubs or organizations: Not on file    Relationship status: Not on file  Other Topics Concern  . Not on file  Social History Narrative  . Not on file   Family History: Family History  Problem Relation Age of Onset  . Colon polyps Father   . Colon polyps Brother   . Colon cancer Neg Hx   . Rectal cancer Neg Hx   . Stomach cancer Neg Hx   . Esophageal cancer Neg Hx    Allergies: Allergies  Allergen Reactions  . Penicillins    Medications: See med rec.  Review of Systems: No fevers, chills, night sweats, weight loss, chest pain, or shortness of breath.   Objective:    General: Well Developed, well nourished, and in no acute distress.  Neuro: Alert and oriented x3, extra-ocular muscles intact, sensation grossly intact.  HEENT: Normocephalic, atraumatic, pupils equal round reactive to light, neck supple, no masses, no lymphadenopathy, thyroid nonpalpable.  Oropharynx, ear  canals unremarkable, boggy and erythematous turbinates in the nasopharynx with tenderness to palpation and percussion over the frontal sinuses.  Maxillary sinuses are unremarkable. Skin: Warm and dry, no rashes. Cardiac: Regular rate and rhythm, no murmurs rubs or gallops, no lower extremity edema.  Respiratory: Clear to auscultation bilaterally. Not using accessory muscles, speaking in full sentences.  Impression and Recommendations:    Annual physical exam She will return for an annual physical, I am going to go ahead and teehim up with some labs. We can get a tetanus shot when he comes back for his physical.  Acute non-recurrent frontal sinusitis With concurrent eustachian tube dysfunction. 5 days of prednisone, switch from Zyrtec to Zyrtec-D for a  week. Flonase. Azithromycin.  ___________________________________________ Gwen Her. Dianah Field, M.D., ABFM., CAQSM. Primary Care and [place] Instructor of [place] of [employer]

## 2018-01-01 NOTE — Patient Instructions (Signed)
With concurrent eustachian tube dysfunction. 5 days of prednisone, switch from Zyrtec to Zyrtec-D for a week. Flonase. Azithromycin.

## 2018-01-01 NOTE — Assessment & Plan Note (Signed)
She will return for an annual physical, I am going to go ahead and teehim up with some labs. We can get a tetanus shot when he comes back for his physical.

## 2018-01-01 NOTE — Assessment & Plan Note (Signed)
With concurrent eustachian tube dysfunction. 5 days of prednisone, switch from Zyrtec to Zyrtec-D for a week. Flonase. Azithromycin.

## 2018-01-07 ENCOUNTER — Telehealth: Payer: Self-pay

## 2018-01-07 NOTE — Telephone Encounter (Signed)
With regards to the sinusitis and eustachian tube dysfunction? I think he just needs to give it more time, one more week.

## 2018-01-07 NOTE — Telephone Encounter (Signed)
Pt left VM stating he's feeling about 80% better but has stopped in progression. Would like if there's anything else we can do/try at this time. Please advise.

## 2018-01-12 NOTE — Telephone Encounter (Signed)
Left VM with information.  

## 2018-02-28 ENCOUNTER — Other Ambulatory Visit: Payer: Self-pay | Admitting: Sports Medicine

## 2018-05-26 ENCOUNTER — Other Ambulatory Visit: Payer: Self-pay | Admitting: Sports Medicine

## 2018-05-26 DIAGNOSIS — G4733 Obstructive sleep apnea (adult) (pediatric): Secondary | ICD-10-CM

## 2018-05-26 MED ORDER — DOXAZOSIN MESYLATE ER 8 MG PO TB24: 8.0000 mg | ORAL_TABLET | Freq: Every day | ORAL | 3 refills | Status: DC

## 2018-07-01 ENCOUNTER — Other Ambulatory Visit: Payer: Self-pay | Admitting: Sports Medicine

## 2018-07-05 ENCOUNTER — Other Ambulatory Visit: Payer: Self-pay | Admitting: Sports Medicine

## 2018-09-22 ENCOUNTER — Other Ambulatory Visit: Payer: Self-pay | Admitting: Sports Medicine

## 2018-09-22 MED ORDER — VALACYCLOVIR HCL 1 G PO TABS: ORAL_TABLET | ORAL | 11 refills | Status: AC

## 2018-10-05 ENCOUNTER — Other Ambulatory Visit: Payer: Self-pay | Admitting: Sports Medicine

## 2018-10-05 MED ORDER — DOXAZOSIN MESYLATE 8 MG PO TABS: 8.0000 mg | ORAL_TABLET | Freq: Every day | ORAL | 3 refills | Status: DC

## 2018-11-16 ENCOUNTER — Other Ambulatory Visit: Payer: Self-pay | Admitting: Sports Medicine

## 2018-11-17 NOTE — Telephone Encounter (Signed)
>  6 months since visit, lets set him up for a e visit.

## 2018-11-17 NOTE — Telephone Encounter (Signed)
Left a message for patient to call and schedule an appointment.

## 2018-12-02 ENCOUNTER — Other Ambulatory Visit: Payer: Self-pay | Admitting: Sports Medicine

## 2018-12-03 NOTE — Telephone Encounter (Signed)
Have not seen him in almost a year, virtual visit or in person visit needed for refill

## 2018-12-03 NOTE — Telephone Encounter (Signed)
Last OV was 12/2017 for CPE and per note to follow up in 1 year. Please advise.

## 2018-12-03 NOTE — Telephone Encounter (Signed)
I left a brief VM to call back and set up a follow up. FYI.

## 2018-12-07 NOTE — Telephone Encounter (Signed)
Patient has a follow up on 12/09/2018.

## 2018-12-09 ENCOUNTER — Telehealth (INDEPENDENT_AMBULATORY_CARE_PROVIDER_SITE_OTHER): Payer: 59 | Admitting: Sports Medicine

## 2018-12-09 DIAGNOSIS — F411 Generalized anxiety disorder: Secondary | ICD-10-CM | POA: Diagnosis not present

## 2018-12-09 MED ORDER — SERTRALINE HCL 100 MG PO TABS: 100.0000 mg | ORAL_TABLET | Freq: Every day | ORAL | 1 refills | Status: DC

## 2018-12-09 NOTE — Progress Notes (Signed)
Virtual Visit via WebEx/MyChart   I connected with  Michael Neal  on 12/09/18 via WebEx/MyChart/Doximity Video and verified that I am speaking with the correct person using two identifiers.   I discussed the limitations, risks, security and privacy concerns of performing an evaluation and management service by WebEx/MyChart/Doximity Video, including the higher likelihood of inaccurate diagnosis and treatment, and the availability of in person appointments.  We also discussed the likely need of an additional face to face encounter for complete and high quality delivery of care.  I also discussed with the patient that there may be a patient responsible charge related to this service. The patient expressed understanding and wishes to proceed.  Provider location is either at home or medical facility. Patient location is at their home, different from provider location. People involved in care of the patient during this telehealth encounter were myself, my nurse/medical assistant, and my front office/scheduling team member.  Subjective:    CC: Discuss medications  HPI: This is a pleasant 53 year old male, he is doing extremely well, needs a refill on sertraline, he had his labs done through his employer, no suicidal homicidal ideation, happy with how things are going so far.  I reviewed the past medical history, family history, social history, surgical history, and allergies today and no changes were needed.  Please see the problem list section below in epic for further details.  Past Medical History: Past Medical History:  Diagnosis Date  . Allergy    seasonal  . BPH (benign prostatic hyperplasia)   . Hx of adenomatous polyp of colon 07/08/2015  . Psoriatic arthritis [facility])    Past Surgical History: Past Surgical History:  Procedure Laterality Date  . WISDOM TOOTH EXTRACTION     with sedation   Social History: Social History   Socioeconomic History  . Marital status: Unknown   Spouse name: Not on file  . Number of children: Not on file  . Years of education: Not on file  . Highest education level: Not on file  Occupational History  . Not on file  Social Needs  . Financial resource strain: Not on file  . Food insecurity:    Worry: Not on file    Inability: Not on file  . Transportation needs:    Medical: Not on file    Non-medical: Not on file  Tobacco Use  . Smoking status: Former Smoker    Last attempt to quit: 08/07/2012    Years since quitting: 6.3  . Smokeless tobacco: Never Used  Substance and Sexual Activity  . Alcohol use: No    Alcohol/week: 0.0 standard drinks  . Drug use: No  . Sexual activity: Not on file  Lifestyle  . Physical activity:    Days per week: Not on file    Minutes per session: Not on file  . Stress: Not on file  Relationships  . Social connections:    Talks on phone: Not on file    Gets together: Not on file    Attends religious service: Not on file    Active member of club or organization: Not on file    Attends meetings of clubs or organizations: Not on file    Relationship status: Not on file  Other Topics Concern  . Not on file  Social History Narrative  . Not on file   Family History: Family History  Problem Relation Age of Onset  . Colon polyps Father   . Colon polyps Brother   .  Colon cancer Neg Hx   . Rectal cancer Neg Hx   . Stomach cancer Neg Hx   . Esophageal cancer Neg Hx    Allergies: Allergies  Allergen Reactions  . Penicillins    Medications: See med rec.  Review of Systems: No fevers, chills, night sweats, weight loss, chest pain, or shortness of breath.   Objective:    General: Speaking full sentences, no audible heavy breathing.  Sounds alert and appropriately interactive.  Appears well.  Face symmetric.  Extraocular movements intact.  Pupils equal and round.  No nasal flaring or accessory muscle use visualized.  No other physical exam performed due to the non-physical nature of this  visit.  Impression and Recommendations:    Generalized anxiety disorder Symptoms very well controlled with Zoloft. Refilling medication, he will keep visits at least every 6 months. He did get labs done through his employer and will have those forwarded to me, he tells me everything was noted He and his family are currently headed down to their house on [place].   I discussed the above assessment and treatment plan with the patient. The patient was provided an opportunity to ask questions and all were answered. The patient agreed with the plan and demonstrated an understanding of the instructions.   The patient was advised to call back or seek an in-person evaluation if the symptoms worsen or if the condition fails to improve as anticipated.   I provided 25 minutes of non-face-to-face time during this encounter, 15 minutes of additional time was needed to gather information, review chart, records, communicate/coordinate with staff remotely, troubleshooting the multiple errors that we get every time when trying to do video calls through the electronic medical record, WebEx, and Doximity, restart the encounter multiple times due to instability of the software, as well as complete documentation.   ___________________________________________ Gwen Her. Dianah Field, M.D., ABFM., CAQSM. Primary Care and Sports Medicine [facility]  Adjunct Professor of [place] [employer] [facility] [employer]

## 2018-12-09 NOTE — Assessment & Plan Note (Signed)
Symptoms very well controlled with Zoloft. Refilling medication, he will keep visits at least every 6 months. He did get labs done through his employer and will have those forwarded to me, he tells me everything was noted He and his family are currently headed down to their house on [place].

## 2019-04-01 ENCOUNTER — Other Ambulatory Visit: Payer: Self-pay | Admitting: Sports Medicine

## 2019-05-26 ENCOUNTER — Other Ambulatory Visit: Payer: Self-pay | Admitting: *Deleted

## 2019-05-26 DIAGNOSIS — L4050 Arthropathic psoriasis, unspecified: Secondary | ICD-10-CM

## 2019-05-26 MED ORDER — CALCIPOTRIENE 0.005 % EX CREA: TOPICAL_CREAM | Freq: Every morning | CUTANEOUS | 3 refills | Status: AC

## 2019-09-02 ENCOUNTER — Other Ambulatory Visit: Payer: Self-pay | Admitting: Sports Medicine

## 2020-05-04 ENCOUNTER — Other Ambulatory Visit (HOSPITAL_COMMUNITY): Payer: Self-pay | Admitting: Nurse Practitioner

## 2022-07-10 ENCOUNTER — Encounter: Payer: Self-pay | Admitting: Internal Medicine
